# Patient Record
Sex: Male | Born: 1990 | Race: Black or African American | Hispanic: No | Marital: Single | Smoking: Never smoker
Health system: Southern US, Community
[De-identification: ages and names within clinical notes are randomized; demographics above are authoritative.]

## PROBLEM LIST (undated history)

## (undated) DIAGNOSIS — Z789 Other specified health status: Secondary | ICD-10-CM

## (undated) HISTORY — PX: NASAL CONCHA BULLOSA RESECTION: SHX2062

---
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
## 2012-04-14 ENCOUNTER — Emergency Department (HOSPITAL_COMMUNITY): Payer: Self-pay

## 2012-04-14 ENCOUNTER — Emergency Department (HOSPITAL_COMMUNITY)
Admission: EM | Admit: 2012-04-14 | Discharge: 2012-04-14 | Disposition: A | Discharge status: Home / Self Care | Payer: Self-pay | Attending: Emergency Medicine | Admitting: Emergency Medicine

## 2012-04-14 ENCOUNTER — Encounter (HOSPITAL_COMMUNITY): Payer: Self-pay | Admitting: Emergency Medicine

## 2012-04-14 DIAGNOSIS — S62309A Unspecified fracture of unspecified metacarpal bone, initial encounter for closed fracture: Secondary | ICD-10-CM

## 2012-04-14 DIAGNOSIS — S62319A Displaced fracture of base of unspecified metacarpal bone, initial encounter for closed fracture: Secondary | ICD-10-CM | POA: Insufficient documentation

## 2012-04-14 MED ORDER — HYDROCODONE-ACETAMINOPHEN 5-325 MG PO TABS: 2.0000 | ORAL_TABLET | ORAL | Status: AC | PRN

## 2012-04-14 NOTE — ED Notes (Signed)
Patient transported to X-ray 

## 2012-04-14 NOTE — ED Notes (Signed)
Pt. D/C home. Ambulatory. A.O. X 4 NAD. Prescription in hand. D/C teaching reviewed.
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
## 2012-04-14 NOTE — Discharge Instructions (Signed)
Metacarpal Fracture  Follow up with Dr. Melvyn Novas this week.  Return to the ED if you develop new or worsening symptoms.  The metacarpal bones are in the middle of the hand, connecting the fingers to the wrist. A metacarpal fracture is a break in one of these bones. It is common for an injury of the hand to break one or more of these bones. A metacarpal fracture of the fifth (little) finger, near the knuckle, is also known as a boxer's fracture. SYMPTOMS   Severe pain at the time of injury.   Pain, tenderness, swelling (especially the back of the hand).   Bruising of the hand within 48 hours.   Visible deformity, if the fracture out of alignment (displaced).   Numbness or paralysis from swelling in the hand, causing pressure on the blood vessels or nerves (uncommon).  CAUSES   Direct hit (trauma) to the hand, such as a striking blow with the fist.   Indirect stress to the hand, such as twisting or violent muscle contraction (uncommon).  RISK INCREASES WITH:  Contact sports (football, rugby, soccer).   Sports that require hitting (boxing, martial arts).   History of bone or joint disease, including osteoporosis.   Poor hand strength and flexibility.  PREVENTION  Maintain proper conditioning:   Hand and finger strength.   Flexibility and endurance.   For contact sports, wear properly fitted and padded protective equipment for the hand.   Learn and use proper technique when hitting, punching, and landing from a fall.  PROGNOSIS If treated properly, metacarpal fractures can be expected to heal within 4 to 6 weeks. For severe injuries, surgery may be needed. RELATED COMPLICATIONS   Fracture does not heal (nonunion).   Heals in a poor position, including twisted fingers (malunion).   Chronic pain, stiffness, or swelling of the hand.   Excessive bleeding in the hand, causing pressure and injury to nerves and blood vessels (rare).   Unstable or arthritic joint, following  repeated injury or delayed treatment.   Hindrance of normal hand growth in children.   Infection in open fractures (skin broken over fracture) or at the incision or pin sites, if surgery was performed.   Shortening or injured bones.   Bony bump (spur) or loss of shape of the knuckles.  TREATMENT  Treatment will vary, depending on the extent of the injury. First, ice and medicine will help reduce pain and inflammation. For a single metacarpal fracture that is not displaced and does not involve the joint, restraint is usually sufficient for healing to occur. Multiple metacarpal fractures, fractures that are displaced, or fractures involving the joint may require surgery. Surgery often involves placing pins and screws in the bones, to hold them in place. Restraint of the injury follows surgery, to allow for healing. After restraint (with or without surgery), stretching and strengthening exercises may be needed to regain strength and a full range of motion. Exercises may be done at home or with a therapist. Sometimes, depending on the sport and position, a brace or splint may be needed when first returning to sports. MEDICATION   Do not take pain medicine for 7 days before surgery.   Only take over-the-counter or prescription medicines for pain, fever, or discomfort as directed by your caregiver.   Prescription pain medicines are usually prescribed only after surgery. Use only as directed and only as much as you need.  COLD THERAPY  Cold treatment (icing) should be applied for 10 to 15 minutes every 2  to 3 hours for inflammation and pain, and immediately after activity that aggravates your symptoms. Use ice packs or an ice massage. SEEK IMMEDIATE MEDICAL CARE IF:   Pain, tenderness, or swelling gets worse even with treatment.   You have pain, numbness, or coldness in the hand.   Blue, gray, or dark color appears in the fingernails.   Any of the following occur after surgery:   You have an  oral temperature above 102 F (38.9 C), not controlled by medicine.   You have increased pain, swelling, redness, drainage of fluids, or bleeding in the affected area.   New, unexplained symptoms develop. (Drugs used in treatment may produce side effects.)  Document Released: 11/03/1998 Document Revised: 10/09/2011 Document Reviewed: 02/01/2009 [facility] Patient Information 2012 [place], [place].

## 2012-04-14 NOTE — ED Notes (Signed)
Ortho Tech paged to apply splint. 

## 2012-04-14 NOTE — Progress Notes (Signed)
Orthopedic Tech Progress Note Patient Details:  Jahmari Esbenshade [facility] 1991-10-25 [id number]  Ortho Devices Type of Ortho Device: Other (comment) (ulna gutter) Ortho Device/Splint Location: (R) UE Ortho Device/Splint Interventions: Application   Jennye Moccasin 04/14/2012, 2:53 PM

## 2012-04-22 ENCOUNTER — Encounter (HOSPITAL_COMMUNITY): Payer: Self-pay | Admitting: Pharmacy Technician

## 2012-04-23 ENCOUNTER — Encounter (HOSPITAL_COMMUNITY): Payer: Self-pay

## 2012-04-23 ENCOUNTER — Encounter (HOSPITAL_COMMUNITY)
Admission: RE | Admit: 2012-04-23 | Discharge: 2012-04-23 | Disposition: A | Discharge status: Home / Self Care | Payer: Self-pay | Source: Ambulatory Visit | Attending: Orthopedic Surgery | Admitting: Orthopedic Surgery

## 2012-04-23 HISTORY — DX: Other specified health status: Z78.9

## 2012-04-23 LAB — CBC
HCT: 41.6 % (ref 39.0–52.0)
MCHC: 33.2 g/dL (ref 30.0–36.0)
RDW: 11.9 % (ref 11.5–15.5)
WBC: 3.1 10*3/uL — ABNORMAL LOW (ref 4.0–10.5)

## 2012-04-23 LAB — SURGICAL PCR SCREEN
MRSA, PCR: NEGATIVE
Staphylococcus aureus: NEGATIVE

## 2012-04-23 NOTE — Pre-Procedure Instructions (Signed)
20 MANG HAZELRIGG  04/23/2012   Your procedure is scheduled on:  04/26/12  Report to [facility] at hydrocodone AM.  Call this number if you have problems the morning of surgery: [phone]   Remember:   Do not eat food or drink after midnight  Take these medicines the morning of surgery with A SIP OF WATER: hydrocodone   Do not wear jewelry, make-up or nail polish.  Do not wear lotions, powders, or perfumes. You may wear deodorant.  Do not shave 48 hours prior to surgery. Men may shave face and neck.  Do not bring valuables to the hospital.  Contacts, dentures or bridgework may not be worn into surgery.  Leave suitcase in the car. After surgery it may be brought to your room.  For patients admitted to the hospital, checkout time is 11:00 AM the day of discharge.   Patients discharged the day of surgery will not be allowed to drive home.  Name and phone number of your driver: family  Special Instructions: CHG Shower Use Special Wash: 1/2 bottle night before surgery and 1/2 bottle morning of surgery.   Please read over the following fact sheets that you were given: Pain Booklet, Coughing and Deep Breathing, MRSA Information and Surgical Site Infection Prevention

## 2012-04-25 NOTE — H&P (Signed)
Victor Holmes is an 21 y.o. male.   Chief Complaint: right hand injury HPI: Pt injured right hand. ED notes reviewed Pt was seen and evaluated in my office Pt here for surgery on right hand Right ring finger has displaced metacarpal shaft fracture  Past Medical History  Diagnosis Date  . No pertinent past medical history     Past Surgical History  Procedure Date  . Nasal concha bullosa resection     No family history on file. Social History:  reports that he has never smoked. He does not have any smokeless tobacco history on file. He reports that he drinks alcohol. He reports that he does not use illicit drugs.  Allergies: No Known Allergies  No prescriptions prior to admission    No results found for this or any previous visit (from the past 48 hour(s)). No results found.  No recent illnesses or hospitalizations  There were no vitals taken for this visit. General Appearance:  Alert, cooperative, no distress, appears stated age  Head:  Normocephalic, without obvious abnormality, atraumatic  Eyes:  Pupils equal, conjunctiva/corneas clear,         Throat: Lips, mucosa, and tongue normal; teeth and gums normal  Neck: No visible masses     Lungs:   respirations unlabored  Chest Wall:  No tenderness or deformity  Heart:  Regular rate and rhythm,  Abdomen:   Soft, non-tender,         Extremities: Right hand swollen dorsally lacks full mobility Good cap refil No open wounds No scars dorsally Bony prominence over ring finger metacarpal  Pulses: 2+ and symmetric  Skin: Skin color, texture, turgor normal, no rashes or lesions     Neurologic: Normal    Assessment/Plan:  Right ring finger metacarpal shaft fracture  Right ring finger open reduction and internal fixation possible pinning  R/B/A DISCUSSED WITH PT IN OFFICE.  PT VOICED UNDERSTANDING OF PLAN CONSENT SIGNED DAY OF SURGERY PT SEEN AND EXAMINED PRIOR TO OPERATIVE PROCEDURE/DAY OF SURGERY SITE  MARKED. QUESTIONS ANSWERED WILL [facility] FOLLOWING SURGERY  Sharma Covert 04/25/2012, 6:28 PM

## 2012-04-26 ENCOUNTER — Encounter (HOSPITAL_COMMUNITY): Payer: Self-pay | Admitting: Certified Registered"

## 2012-04-26 ENCOUNTER — Encounter (HOSPITAL_COMMUNITY)
Admission: RE | Disposition: A | Discharge status: Home / Self Care | Payer: Self-pay | Source: Ambulatory Visit | Attending: Orthopedic Surgery

## 2012-04-26 ENCOUNTER — Encounter (HOSPITAL_COMMUNITY): Payer: Self-pay | Admitting: *Deleted

## 2012-04-26 ENCOUNTER — Ambulatory Visit (HOSPITAL_COMMUNITY)
Admission: RE | Admit: 2012-04-26 | Discharge: 2012-04-26 | Disposition: A | Discharge status: Home / Self Care | Payer: Self-pay | Source: Ambulatory Visit | Attending: Orthopedic Surgery | Admitting: Orthopedic Surgery

## 2012-04-26 ENCOUNTER — Ambulatory Visit (HOSPITAL_COMMUNITY): Payer: Self-pay | Admitting: Certified Registered"

## 2012-04-26 DIAGNOSIS — X58XXXA Exposure to other specified factors, initial encounter: Secondary | ICD-10-CM | POA: Insufficient documentation

## 2012-04-26 DIAGNOSIS — S62329A Displaced fracture of shaft of unspecified metacarpal bone, initial encounter for closed fracture: Secondary | ICD-10-CM | POA: Insufficient documentation

## 2012-04-26 DIAGNOSIS — Z01812 Encounter for preprocedural laboratory examination: Secondary | ICD-10-CM | POA: Insufficient documentation

## 2012-04-26 HISTORY — PX: ORIF FINGER FRACTURE: SHX2122

## 2012-04-26 SURGERY — OPEN REDUCTION INTERNAL FIXATION (ORIF) METACARPAL (FINGER) FRACTURE
Anesthesia: General | Site: Hand | Laterality: Right | Wound class: Clean

## 2012-04-26 MED ORDER — 0.9 % SODIUM CHLORIDE (POUR BTL) OPTIME
TOPICAL | Status: DC | PRN
  Administered 2012-04-26: 1000 mL

## 2012-04-26 MED ORDER — LACTATED RINGERS IV SOLN
INTRAVENOUS | Status: DC | PRN
  Administered 2012-04-26 (×2): via INTRAVENOUS

## 2012-04-26 MED ORDER — MIDAZOLAM HCL 2 MG/2ML IJ SOLN: 1.0000 mg | INTRAMUSCULAR | Status: DC | PRN

## 2012-04-26 MED ORDER — FENTANYL CITRATE 0.05 MG/ML IJ SOLN
50.0000 ug | INTRAMUSCULAR | Status: DC | PRN
Start: 2012-04-26 — End: 2012-04-27

## 2012-04-26 MED ORDER — BUPIVACAINE HCL (PF) 0.25 % IJ SOLN
INTRAMUSCULAR | Status: DC | PRN
  Administered 2012-04-26: 10 mL

## 2012-04-26 MED ORDER — LIDOCAINE HCL (CARDIAC) 20 MG/ML IV SOLN
INTRAVENOUS | Status: DC | PRN
  Administered 2012-04-26: 80 mg via INTRAVENOUS

## 2012-04-26 MED ORDER — MIDAZOLAM HCL 5 MG/5ML IJ SOLN
INTRAMUSCULAR | Status: DC | PRN
  Administered 2012-04-26: 2 mg via INTRAVENOUS

## 2012-04-26 MED ORDER — PROPOFOL 10 MG/ML IV EMUL
INTRAVENOUS | Status: DC | PRN
  Administered 2012-04-26: 200 mg via INTRAVENOUS

## 2012-04-26 MED ORDER — CHLORHEXIDINE GLUCONATE 4 % EX LIQD: 60.0000 mL | Freq: Once | CUTANEOUS | Status: DC

## 2012-04-26 MED ORDER — FENTANYL CITRATE 0.05 MG/ML IJ SOLN
INTRAMUSCULAR | Status: DC | PRN
  Administered 2012-04-26 (×5): 50 ug via INTRAVENOUS

## 2012-04-26 MED ORDER — OXYCODONE-ACETAMINOPHEN 5-325 MG PO TABS
2.0000 | ORAL_TABLET | Freq: Once | ORAL | Status: AC
  Administered 2012-04-26: 2 via ORAL

## 2012-04-26 MED ORDER — LORAZEPAM 2 MG/ML IJ SOLN: 1.0000 mg | Freq: Once | INTRAMUSCULAR | Status: DC | PRN

## 2012-04-26 MED ORDER — CEFAZOLIN SODIUM 1-5 GM-% IV SOLN
INTRAVENOUS | Status: DC | PRN
  Administered 2012-04-26: 1 g via INTRAVENOUS

## 2012-04-26 MED ORDER — HYDROMORPHONE HCL PF 1 MG/ML IJ SOLN: 0.2500 mg | INTRAMUSCULAR | Status: DC | PRN

## 2012-04-26 MED ORDER — DOCUSATE SODIUM 100 MG PO CAPS: 100.0000 mg | ORAL_CAPSULE | Freq: Two times a day (BID) | ORAL | Status: AC

## 2012-04-26 MED ORDER — OXYCODONE-ACETAMINOPHEN 5-325 MG PO TABS: 1.0000 | ORAL_TABLET | ORAL | Status: AC | PRN

## 2012-04-26 SURGICAL SUPPLY — 64 items
BANDAGE ELASTIC 3 VELCRO ST LF (GAUZE/BANDAGES/DRESSINGS) ×1 IMPLANT
BANDAGE ELASTIC 4 VELCRO ST LF (GAUZE/BANDAGES/DRESSINGS) ×1 IMPLANT
BANDAGE GAUZE ELAST BULKY 4 IN (GAUZE/BANDAGES/DRESSINGS) ×1 IMPLANT
BIT DRILL 1.1 (BIT) ×2
BIT DRILL 1.1 MINI QC NONSTRL (BIT) ×1 IMPLANT
BIT DRILL 60X20X1.1XQC TMX (BIT) IMPLANT
BIT DRL 60X20X1.1XQC TMX (BIT) ×1
BNDG CMPR 9X4 STRL LF SNTH (GAUZE/BANDAGES/DRESSINGS) ×1
BNDG CMPR MD 5X2 ELC HKLP STRL (GAUZE/BANDAGES/DRESSINGS) ×1
BNDG COHESIVE 1X5 TAN STRL LF (GAUZE/BANDAGES/DRESSINGS) IMPLANT
BNDG ELASTIC 2 VLCR STRL LF (GAUZE/BANDAGES/DRESSINGS) ×2 IMPLANT
BNDG ESMARK 4X9 LF (GAUZE/BANDAGES/DRESSINGS) ×2 IMPLANT
CAP PIN ORTHO PINK (CAP) IMPLANT
CAP PIN PROTECTOR ORTHO WHT (CAP) IMPLANT
CLOTH BEACON ORANGE TIMEOUT ST (SAFETY) ×2 IMPLANT
CORDS BIPOLAR (ELECTRODE) ×2 IMPLANT
COVER SURGICAL LIGHT HANDLE (MISCELLANEOUS) ×2 IMPLANT
CUFF TOURNIQUET SINGLE 18IN (TOURNIQUET CUFF) ×2 IMPLANT
CUFF TOURNIQUET SINGLE 24IN (TOURNIQUET CUFF) IMPLANT
DRAPE OEC MINIVIEW 54X84 (DRAPES) ×1 IMPLANT
DRAPE SURG 17X23 STRL (DRAPES) ×1 IMPLANT
DRSG ADAPTIC 3X8 NADH LF (GAUZE/BANDAGES/DRESSINGS) IMPLANT
GAUZE SPONGE 2X2 8PLY STRL LF (GAUZE/BANDAGES/DRESSINGS) IMPLANT
GAUZE XEROFORM 1X8 LF (GAUZE/BANDAGES/DRESSINGS) ×1 IMPLANT
GLOVE BIOGEL PI IND STRL 8.5 (GLOVE) ×1 IMPLANT
GLOVE BIOGEL PI INDICATOR 8.5 (GLOVE) ×1
GLOVE SURG ORTHO 8.0 STRL STRW (GLOVE) ×2 IMPLANT
GOWN PREVENTION PLUS XLARGE (GOWN DISPOSABLE) ×2 IMPLANT
GOWN STRL NON-REIN LRG LVL3 (GOWN DISPOSABLE) ×4 IMPLANT
K-WIRE SMTH SNGL TROCAR .028X4 (WIRE)
KIT BASIN OR (CUSTOM PROCEDURE TRAY) ×2 IMPLANT
KIT ROOM TURNOVER OR (KITS) ×2 IMPLANT
KWIRE SMTH SNGL TROCAR .028X4 (WIRE) IMPLANT
MANIFOLD NEPTUNE II (INSTRUMENTS) ×1 IMPLANT
NDL HYPO 25GX1X1/2 BEV (NEEDLE) IMPLANT
NEEDLE HYPO 25GX1X1/2 BEV (NEEDLE) ×2 IMPLANT
NS IRRIG 1000ML POUR BTL (IV SOLUTION) ×2 IMPLANT
PACK ORTHO EXTREMITY (CUSTOM PROCEDURE TRAY) ×2 IMPLANT
PAD ARMBOARD 7.5X6 YLW CONV (MISCELLANEOUS) ×4 IMPLANT
PAD CAST 4YDX4 CTTN HI CHSV (CAST SUPPLIES) IMPLANT
PADDING CAST ABS 4INX4YD NS (CAST SUPPLIES) ×1
PADDING CAST ABS COTTON 4X4 ST (CAST SUPPLIES) IMPLANT
PADDING CAST COTTON 4X4 STRL (CAST SUPPLIES)
PADDING UNDERCAST 2  STERILE (CAST SUPPLIES) ×2 IMPLANT
PLATE STRAIGHT LOCK 1.5 (Plate) ×1 IMPLANT
SCREW L 1.5X12 (Screw) ×1 IMPLANT
SCREW LOCKING 1.5X11MM (Screw) ×1 IMPLANT
SCREW NL 1.5X11 WRIST (Screw) ×1 IMPLANT
SCREW NONIOC 1.5 10M (Screw) ×1 IMPLANT
SOAP 2 % CHG 4 OZ (WOUND CARE) ×2 IMPLANT
SPLINT FIBERGLASS 3X35 (CAST SUPPLIES) ×1 IMPLANT
SPONGE GAUZE 2X2 STER 10/PKG (GAUZE/BANDAGES/DRESSINGS)
SPONGE GAUZE 4X4 12PLY (GAUZE/BANDAGES/DRESSINGS) ×1 IMPLANT
SUCTION FRAZIER TIP 10 FR DISP (SUCTIONS) IMPLANT
SUT MERSILENE 4 0 P 3 (SUTURE) IMPLANT
SUT MNCRL AB 3-0 PS2 18 (SUTURE) ×1 IMPLANT
SUT MNCRL AB 4-0 PS2 18 (SUTURE) ×1 IMPLANT
SUT PROLENE 4 0 PS 2 18 (SUTURE) ×1 IMPLANT
SYR CONTROL 10ML LL (SYRINGE) ×1 IMPLANT
TOWEL OR 17X24 6PK STRL BLUE (TOWEL DISPOSABLE) ×2 IMPLANT
TOWEL OR 17X26 10 PK STRL BLUE (TOWEL DISPOSABLE) ×2 IMPLANT
TUBE CONNECTING 12X1/4 (SUCTIONS) IMPLANT
UNDERPAD 30X30 INCONTINENT (UNDERPADS AND DIAPERS) ×2 IMPLANT
WATER STERILE IRR 1000ML POUR (IV SOLUTION) ×2 IMPLANT

## 2012-04-26 NOTE — Brief Op Note (Signed)
04/26/2012  7:50 PM  PATIENT:  Haze Justin Bernhart  21 y.o. male  PRE-OPERATIVE DIAGNOSIS:  RIGHT RING FINGER METACARPAL SHAFT FRACTURE  POST-OPERATIVE DIAGNOSIS:  * No post-op diagnosis entered *  PROCEDURE:  Procedure(s) (LRB): OPEN REDUCTION INTERNAL FIXATION (ORIF) METACARPAL (FINGER) FRACTURE (Right)  SURGEON:  Surgeon(s) and Role:    * Sharma Covert, MD - Primary  PHYSICIAN ASSISTANT:   ASSISTANTS: none   ANESTHESIA:   general  EBL:     BLOOD ADMINISTERED:none  DRAINS: none   LOCAL MEDICATIONS USED:  MARCAINE     SPECIMEN:  No Specimen  DISPOSITION OF SPECIMEN:  N/A  COUNTS:  YES  TOURNIQUET:  * Missing tourniquet times found for documented tourniquets in log:  [id number] *  DICTATION: .409811  PLAN OF CARE: Discharge to home after PACU  PATIENT DISPOSITION:  PACU - hemodynamically stable.   Delay start of Pharmacological VTE agent (>24hrs) due to surgical blood loss or risk of bleeding: not applicable

## 2012-04-26 NOTE — Anesthesia Postprocedure Evaluation (Signed)
  Anesthesia Post-op Note  Patient: Victor Holmes  Procedure(s) Performed: Procedure(s) (LRB): OPEN REDUCTION INTERNAL FIXATION (ORIF) METACARPAL (FINGER) FRACTURE (Right)  Patient Location: PACU  Anesthesia Type: General  Level of Consciousness: awake and alert   Airway and Oxygen Therapy: Patient Spontanous Breathing  Post-op Pain: mild  Post-op Assessment: Post-op Vital signs reviewed, Patient's Cardiovascular Status Stable, Respiratory Function Stable, Patent Airway, No signs of Nausea or vomiting and Pain level controlled  Post-op Vital Signs: stable  Complications: No apparent anesthesia complications

## 2012-04-26 NOTE — Transfer of Care (Signed)
Immediate Anesthesia Transfer of Care Note  Patient: Victor Holmes  Procedure(s) Performed: Procedure(s) (LRB): OPEN REDUCTION INTERNAL FIXATION (ORIF) METACARPAL (FINGER) FRACTURE (Right)  Patient Location: PACU  Anesthesia Type: General  Level of Consciousness: awake, alert  and oriented  Airway & Oxygen Therapy: Patient Spontanous Breathing and Patient connected to nasal cannula oxygen  Post-op Assessment: Report given to PACU RN, Post -op Vital signs reviewed and stable and Patient moving all extremities  Post vital signs: Reviewed and stable  Complications: No apparent anesthesia complications

## 2012-04-26 NOTE — Discharge Instructions (Signed)
KEEP BANDAGE CLEAN AND DRY CALL OFFICE FOR F/U APPT [phone] in 2 weeks KEEP HAND ELEVATED ABOVE HEART OK TO APPLY ICE TO OPERATIVE AREA CONTACT OFFICE IF ANY WORSENING PAIN OR CONCERNS.  

## 2012-04-26 NOTE — Anesthesia Preprocedure Evaluation (Addendum)
Anesthesia Evaluation  Patient identified by MRN, date of birth, ID band Patient awake    Reviewed: Allergy & Precautions, H&P , NPO status , Patient's Chart, lab work & pertinent test results  History of Anesthesia Complications Negative for: history of anesthetic complications  Airway Mallampati: I TM Distance: >3 FB Neck ROM: Full    Dental  (+) Teeth Intact   Pulmonary  breath sounds clear to auscultation        Cardiovascular Rhythm:Regular Rate:Normal     Neuro/Psych    GI/Hepatic   Endo/Other    Renal/GU      Musculoskeletal   Abdominal   Peds  (+) Delivery details - Hematology   Anesthesia Other Findings   Reproductive/Obstetrics                          Anesthesia Physical Anesthesia Plan  ASA: II and Emergent  Anesthesia Plan: General   Post-op Pain Management:    Induction: Intravenous  Airway Management Planned: LMA  Additional Equipment:   Intra-op Plan:   Post-operative Plan: Extubation in OR  Informed Consent: I have reviewed the patients History and Physical, chart, labs and discussed the procedure including the risks, benefits and alternatives for the proposed anesthesia with the patient or authorized representative who has indicated his/her understanding and acceptance.   Dental advisory given  Plan Discussed with: Surgeon and CRNA  Anesthesia Plan Comments:        Anesthesia Quick Evaluation
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
## 2012-04-29 ENCOUNTER — Encounter (HOSPITAL_COMMUNITY): Payer: Self-pay | Admitting: Orthopedic Surgery
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
## 2014-09-30 ENCOUNTER — Emergency Department (HOSPITAL_COMMUNITY)
Admission: EM | Admit: 2014-09-30 | Discharge: 2014-10-01 | Disposition: A | Discharge status: Home / Self Care | Payer: Self-pay | Attending: Emergency Medicine | Admitting: Emergency Medicine

## 2014-09-30 ENCOUNTER — Encounter (HOSPITAL_COMMUNITY): Payer: Self-pay | Admitting: *Deleted

## 2014-09-30 DIAGNOSIS — S50311A Abrasion of right elbow, initial encounter: Secondary | ICD-10-CM | POA: Insufficient documentation

## 2014-09-30 DIAGNOSIS — R55 Syncope and collapse: Secondary | ICD-10-CM | POA: Insufficient documentation

## 2014-09-30 DIAGNOSIS — S6991XA Unspecified injury of right wrist, hand and finger(s), initial encounter: Secondary | ICD-10-CM | POA: Insufficient documentation

## 2014-09-30 DIAGNOSIS — R51 Headache: Secondary | ICD-10-CM

## 2014-09-30 DIAGNOSIS — S8991XA Unspecified injury of right lower leg, initial encounter: Secondary | ICD-10-CM | POA: Insufficient documentation

## 2014-09-30 DIAGNOSIS — Y9389 Activity, other specified: Secondary | ICD-10-CM | POA: Insufficient documentation

## 2014-09-30 DIAGNOSIS — Y998 Other external cause status: Secondary | ICD-10-CM | POA: Insufficient documentation

## 2014-09-30 DIAGNOSIS — S060X9A Concussion with loss of consciousness of unspecified duration, initial encounter: Secondary | ICD-10-CM | POA: Insufficient documentation

## 2014-09-30 DIAGNOSIS — S50312A Abrasion of left elbow, initial encounter: Secondary | ICD-10-CM | POA: Insufficient documentation

## 2014-09-30 DIAGNOSIS — Y9289 Other specified places as the place of occurrence of the external cause: Secondary | ICD-10-CM | POA: Insufficient documentation

## 2014-09-30 DIAGNOSIS — R52 Pain, unspecified: Secondary | ICD-10-CM

## 2014-09-30 DIAGNOSIS — S0031XA Abrasion of nose, initial encounter: Secondary | ICD-10-CM | POA: Insufficient documentation

## 2014-09-30 DIAGNOSIS — R1110 Vomiting, unspecified: Secondary | ICD-10-CM | POA: Insufficient documentation

## 2014-09-30 DIAGNOSIS — S6992XA Unspecified injury of left wrist, hand and finger(s), initial encounter: Secondary | ICD-10-CM | POA: Insufficient documentation

## 2014-09-30 DIAGNOSIS — S0993XA Unspecified injury of face, initial encounter: Secondary | ICD-10-CM

## 2014-09-30 DIAGNOSIS — S0081XA Abrasion of other part of head, initial encounter: Secondary | ICD-10-CM | POA: Insufficient documentation

## 2014-09-30 DIAGNOSIS — R519 Headache, unspecified: Secondary | ICD-10-CM

## 2014-09-30 DIAGNOSIS — S0990XA Unspecified injury of head, initial encounter: Secondary | ICD-10-CM

## 2014-09-30 MED ORDER — ONDANSETRON HCL 4 MG/2ML IJ SOLN
4.0000 mg | Freq: Once | INTRAMUSCULAR | Status: AC
  Administered 2014-10-01: 4 mg via INTRAVENOUS
  Filled 2014-09-30: qty 2

## 2014-09-30 MED ORDER — TETANUS-DIPHTH-ACELL PERTUSSIS 5-2.5-18.5 LF-MCG/0.5 IM SUSP
0.5000 mL | Freq: Once | INTRAMUSCULAR | Status: DC
  Filled 2014-09-30: qty 0.5
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
## 2014-10-01 ENCOUNTER — Emergency Department (HOSPITAL_COMMUNITY): Payer: Self-pay

## 2014-10-01 LAB — CBC
HCT: 42.8 % (ref 39.0–52.0)
HEMOGLOBIN: 14.3 g/dL (ref 13.0–17.0)
MCH: 29.8 pg (ref 26.0–34.0)
MCHC: 33.4 g/dL (ref 30.0–36.0)
MCV: 89.2 fL (ref 78.0–100.0)
PLATELETS: 299 10*3/uL (ref 150–400)
RBC: 4.8 MIL/uL (ref 4.22–5.81)
RDW: 12.1 % (ref 11.5–15.5)
WBC: 10 10*3/uL (ref 4.0–10.5)

## 2014-10-01 LAB — URINALYSIS, ROUTINE W REFLEX MICROSCOPIC
Bilirubin Urine: NEGATIVE
GLUCOSE, UA: NEGATIVE mg/dL
Hgb urine dipstick: NEGATIVE
KETONES UR: NEGATIVE mg/dL
LEUKOCYTES UA: NEGATIVE
NITRITE: NEGATIVE
PH: 6.5 (ref 5.0–8.0)
Protein, ur: NEGATIVE mg/dL
SPECIFIC GRAVITY, URINE: 1.011 (ref 1.005–1.030)
Urobilinogen, UA: 0.2 mg/dL (ref 0.0–1.0)

## 2014-10-01 MED ORDER — SODIUM CHLORIDE 0.9 % IV BOLUS (SEPSIS)
1000.0000 mL | Freq: Once | INTRAVENOUS | Status: AC
  Administered 2014-10-01: 1000 mL via INTRAVENOUS

## 2014-10-01 MED ORDER — MORPHINE SULFATE 2 MG/ML IJ SOLN
2.0000 mg | Freq: Once | INTRAMUSCULAR | Status: AC
  Administered 2014-10-01: 2 mg via INTRAVENOUS
  Filled 2014-10-01: qty 1

## 2014-10-01 MED ORDER — NAPROXEN 500 MG PO TABS: 500.0000 mg | ORAL_TABLET | Freq: Two times a day (BID) | ORAL | Status: DC

## 2014-10-01 MED ORDER — TRAMADOL HCL 50 MG PO TABS: 50.0000 mg | ORAL_TABLET | Freq: Four times a day (QID) | ORAL | Status: AC | PRN

## 2014-10-01 NOTE — ED Notes (Signed)
Pt refusing TDAP, educated on risks vs benefits and still refusing

## 2014-10-01 NOTE — Discharge Instructions (Signed)
Your xrays show no broken bones - see your doctor as needed - RICE therapy -s ee attached

## 2016-04-25 ENCOUNTER — Emergency Department (HOSPITAL_COMMUNITY)
Admission: EM | Admit: 2016-04-25 | Discharge: 2016-04-25 | Disposition: A | Discharge status: Home / Self Care | Payer: Self-pay | Attending: Dermatology | Admitting: Dermatology

## 2016-04-25 DIAGNOSIS — Z5321 Procedure and treatment not carried out due to patient leaving prior to being seen by health care provider: Secondary | ICD-10-CM | POA: Insufficient documentation

## 2016-04-25 NOTE — ED Notes (Signed)
No answer when called for triage 

## 2016-04-26 ENCOUNTER — Encounter (HOSPITAL_COMMUNITY): Payer: Self-pay

## 2016-04-26 ENCOUNTER — Emergency Department (HOSPITAL_COMMUNITY)
Admission: EM | Admit: 2016-04-26 | Discharge: 2016-04-26 | Disposition: A | Discharge status: Home / Self Care | Payer: Self-pay | Attending: Emergency Medicine | Admitting: Emergency Medicine

## 2016-04-26 DIAGNOSIS — Y999 Unspecified external cause status: Secondary | ICD-10-CM | POA: Insufficient documentation

## 2016-04-26 DIAGNOSIS — S61210A Laceration without foreign body of right index finger without damage to nail, initial encounter: Secondary | ICD-10-CM | POA: Insufficient documentation

## 2016-04-26 DIAGNOSIS — W260XXA Contact with knife, initial encounter: Secondary | ICD-10-CM | POA: Insufficient documentation

## 2016-04-26 DIAGNOSIS — S61219A Laceration without foreign body of unspecified finger without damage to nail, initial encounter: Secondary | ICD-10-CM

## 2016-04-26 DIAGNOSIS — Y929 Unspecified place or not applicable: Secondary | ICD-10-CM | POA: Insufficient documentation

## 2016-04-26 DIAGNOSIS — Y93G1 Activity, food preparation and clean up: Secondary | ICD-10-CM | POA: Insufficient documentation

## 2016-04-26 NOTE — ED Notes (Signed)
Declined W/C at D/C and was escorted to lobby by RN. 

## 2016-04-26 NOTE — ED Provider Notes (Signed)
CSN: [id number]     Arrival date & time 04/26/16  0930 History  By signing my name below, I, Tanda RockersMargaux Venter, attest that this documentation has been prepared under the direction and in the presence of Audry Piliyler Warda Mcqueary, PA-C.  Electronically Signed: Tanda RockersMargaux Venter, ED Scribe. 04/26/2016. 9:53 AM.   No chief complaint on file.  The history is provided by the patient. No language interpreter was used.   HPI Comments: Victor Holmes is a 25 y.o. male who presents to the Emergency Department complaining of laceration to left index finger that occurred last night at 10:30 PM. Pt reports that he was washing dishes when he accidentally cut his finger with a knife. He has been applying pressure to the area and applied neosporin to it. Bleeding is controlled with pressure. He notes mild pain to the area as well. Pain is 7/10. Pt has not taken any medication for the pain. Denies weakness, numbness, tingling, fever, chills, or any other associated symptoms. Tetanus up to date.   Past Medical History  Diagnosis Date  . No pertinent past medical history    Past Surgical History  Procedure Laterality Date  . Nasal concha bullosa resection    . Orif finger fracture  04/26/2012    Procedure: OPEN REDUCTION INTERNAL FIXATION (ORIF) METACARPAL (FINGER) FRACTURE;  Surgeon: Sharma CovertFred W Ortmann, MD;  Location: [facility] OR;  Service: Orthopedics;  Laterality: Right;   No family history on file. Social History  Substance Use Topics  . Smoking status: Never Smoker   . Smokeless tobacco: None  . Alcohol Use: Yes     Comment: other wweekly    Review of Systems  Constitutional: Negative for fever and chills.  Musculoskeletal: Positive for arthralgias.  Skin: Positive for wound.  Neurological: Negative for weakness and numbness.   Allergies  Review of patient's allergies indicates no known allergies.  Home Medications   Prior to Admission medications   Medication Sig Start Date End Date Taking? Authorizing Provider   naproxen (NAPROSYN) 500 MG tablet Take 1 tablet (500 mg total) by mouth 2 (two) times daily with a meal. 10/01/14   Eber HongBrian Miller, MD  traMADol (ULTRAM) 50 MG tablet Take 1 tablet (50 mg total) by mouth every 6 (six) hours as needed. 10/01/14   Eber HongBrian Miller, MD   BP 112/67 mmHg  Pulse 62  Temp(Src) 97.5 F (36.4 C)  Resp 18  SpO2 100%   Physical Exam  Constitutional: He is oriented to person, place, and time. He appears well-developed and well-nourished. No distress.  HENT:  Head: Normocephalic and atraumatic.  Eyes: Conjunctivae and EOM are normal.  Neck: Neck supple. No tracheal deviation present.  Cardiovascular: Normal rate.   Pulmonary/Chest: Effort normal. No respiratory distress.  Musculoskeletal: Normal range of motion.  Left index finger with superficial laceration distal to DIP. Bleeding controlled. Neurovascularly intact.   Neurological: He is alert and oriented to person, place, and time.  Skin: Skin is warm and dry.  Psychiatric: He has a normal mood and affect. His behavior is normal.  Nursing note and vitals reviewed.  ED Course  Procedures (including critical care time)   DIAGNOSTIC STUDIES: Oxygen Saturation is 100% on RA, normal by my interpretation.    COORDINATION OF CARE: 9:51 AM-Discussed treatment plan which includes wound care with pt at bedside and pt agreed to plan.   Labs Review Labs Reviewed - No data to display  Imaging Review No results found. I have personally reviewed and evaluated these images and  lab results as part of my medical decision-making.   EKG Interpretation None      MDM  I have reviewed the relevant previous healthcare records. I obtained HPI from historian.  ED Course:  Assessment: Pt with left index finger laceration that occurred last night. Tetanus UTD. Laceration too wide for wound closure with sutures, but It is superficial in nature and should heal well without complication, no signs of infection. Neurovascularly  intact. Bleeding is controlled. No indication for ABX.  Counciled.pt to keep area clean with neosporin and wrapped until follow up with PCP.  Pt is hemodynamically stable with no complaints prior to dc.    Disposition/Plan:  DC Home Additional Verbal discharge instructions given and discussed with patient.  Pt Instructed to f/u with PCP in the next week for evaluation and treatment of symptoms. Return precautions given Pt acknowledges and agrees with plan  Supervising Physician Cathren LaineKevin Steinl, MD   Final diagnoses:  Finger laceration, initial encounter   I personally performed the services described in this documentation, which was scribed in my presence. The recorded information has been reviewed and is accurate.   Audry Piliyler Amara Manalang, PA-C 04/26/16 [id number]  Cathren LaineKevin Steinl, MD 04/26/16 1332

## 2016-04-26 NOTE — ED Notes (Addendum)
Patient here last night but left prior to being seen. Laceration left hand index finger with knife. Saline dressing applied.

## 2016-04-26 NOTE — Discharge Instructions (Signed)
Please read and follow all provided instructions.  Your diagnoses today include:  1. Finger laceration, initial encounter    Tests performed today include:  Vital signs. See below for your results today.   Medications prescribed:   Take as prescribed   Home care instructions:  Follow any educational materials contained in this packet.  Follow-up instructions: Please follow-up with your primary care provider for further evaluation of symptoms and treatment   Return instructions:   Please return to the Emergency Department if you do not get better, if you get worse, or new symptoms OR  - Fever (temperature greater than 101.33F)  - Bleeding that does not stop with holding pressure to the area    -Severe pain (please note that you may be more sore the day after your accident)  - Chest Pain  - Difficulty breathing  - Severe nausea or vomiting  - Inability to tolerate food and liquids  - Passing out  - Skin becoming red around your wounds  - Change in mental status (confusion or lethargy)  - New numbness or weakness     Please return if you have any other emergent concerns.  Additional Information:  Your vital signs today were: BP 112/67 mmHg   Pulse 62   Temp(Src) 97.5 F (36.4 C)   Resp 18   SpO2 100% If your blood pressure (BP) was elevated above 135/85 this visit, please have this repeated by your doctor within one month. ---------------

## 2018-02-21 ENCOUNTER — Other Ambulatory Visit: Payer: Self-pay

## 2018-02-21 ENCOUNTER — Emergency Department (HOSPITAL_COMMUNITY)
Admission: EM | Admit: 2018-02-21 | Discharge: 2018-02-21 | Disposition: A | Discharge status: Home / Self Care | Payer: Self-pay | Attending: Emergency Medicine | Admitting: Emergency Medicine

## 2018-02-21 ENCOUNTER — Emergency Department (HOSPITAL_COMMUNITY): Payer: Self-pay

## 2018-02-21 ENCOUNTER — Encounter (HOSPITAL_COMMUNITY): Payer: Self-pay | Admitting: Emergency Medicine

## 2018-02-21 DIAGNOSIS — N451 Epididymitis: Secondary | ICD-10-CM | POA: Insufficient documentation

## 2018-02-21 LAB — COMPREHENSIVE METABOLIC PANEL
ALBUMIN: 4 g/dL (ref 3.5–5.0)
ALK PHOS: 110 U/L (ref 38–126)
ALT: 8 U/L — AB (ref 17–63)
ANION GAP: 8 (ref 5–15)
AST: 16 U/L (ref 15–41)
BILIRUBIN TOTAL: 0.9 mg/dL (ref 0.3–1.2)
BUN: 6 mg/dL (ref 6–20)
CALCIUM: 9.2 mg/dL (ref 8.9–10.3)
CO2: 26 mmol/L (ref 22–32)
Chloride: 102 mmol/L (ref 101–111)
Creatinine, Ser: 0.86 mg/dL (ref 0.61–1.24)
GFR calc Af Amer: >60 mL/min (ref >60)
GFR calc non Af Amer: >60 mL/min (ref >60)
GLUCOSE: 110 mg/dL — AB (ref 65–99)
Potassium: 3.7 mmol/L (ref 3.5–5.1)
Sodium: 136 mmol/L (ref 135–145)
TOTAL PROTEIN: 7.6 g/dL (ref 6.5–8.1)

## 2018-02-21 LAB — CBC
HEMATOCRIT: 41.2 % (ref 39.0–52.0)
HEMOGLOBIN: 13.4 g/dL (ref 13.0–17.0)
MCH: 30.3 pg (ref 26.0–34.0)
MCHC: 32.5 g/dL (ref 30.0–36.0)
MCV: 93.2 fL (ref 78.0–100.0)
Platelets: 292 10*3/uL (ref 150–400)
RBC: 4.42 MIL/uL (ref 4.22–5.81)
RDW: 12.1 % (ref 11.5–15.5)
WBC: 5.5 10*3/uL (ref 4.0–10.5)

## 2018-02-21 LAB — URINALYSIS, ROUTINE W REFLEX MICROSCOPIC
BILIRUBIN URINE: NEGATIVE
Glucose, UA: NEGATIVE mg/dL
HGB URINE DIPSTICK: NEGATIVE
Ketones, ur: NEGATIVE mg/dL
NITRITE: NEGATIVE
PROTEIN: NEGATIVE mg/dL
RBC / HPF: NONE SEEN RBC/hpf (ref 0–5)
Specific Gravity, Urine: 1.017 (ref 1.005–1.030)
pH: 7 (ref 5.0–8.0)

## 2018-02-21 LAB — LIPASE, BLOOD: Lipase: 20 U/L (ref 11–51)

## 2018-02-21 MED ORDER — DOXYCYCLINE HYCLATE 100 MG PO TABS
100.0000 mg | ORAL_TABLET | Freq: Once | ORAL | Status: AC
  Administered 2018-02-21: 100 mg via ORAL
  Filled 2018-02-21: qty 1

## 2018-02-21 MED ORDER — LIDOCAINE HCL (PF) 1 % IJ SOLN
INTRAMUSCULAR | Status: AC
  Administered 2018-02-21: 5 mL
  Filled 2018-02-21: qty 5

## 2018-02-21 MED ORDER — DOXYCYCLINE HYCLATE 100 MG PO CAPS: 100.0000 mg | ORAL_CAPSULE | Freq: Two times a day (BID) | ORAL | 0 refills | Status: DC

## 2018-02-21 MED ORDER — CEFTRIAXONE SODIUM 250 MG IJ SOLR
250.0000 mg | Freq: Once | INTRAMUSCULAR | Status: AC
  Administered 2018-02-21: 250 mg via INTRAMUSCULAR
  Filled 2018-02-21: qty 250

## 2018-02-21 MED ORDER — DOXYCYCLINE HYCLATE 100 MG PO TABS: 100.0000 mg | ORAL_TABLET | Freq: Once | ORAL | Status: DC

## 2018-02-21 NOTE — ED Triage Notes (Signed)
Patient presents to ED for assessment of RLQ abdominal pain waking him up from sleep 2 hours ago.  Patient denies any associated symptoms at this time.

## 2018-02-21 NOTE — ED Notes (Signed)
Patient transported to Ultrasound 

## 2018-02-21 NOTE — ED Provider Notes (Signed)
[facility] EMERGENCY DEPARTMENT Provider Note  CSN: [id number] Arrival date & time: 02/21/18 1478  Chief Complaint(s) Abdominal Pain  HPI Victor Holmes is a 27 y.o. male   The history is provided by the patient.  Abdominal Pain   This is a new problem. The current episode started 3 to 5 hours ago. The problem occurs constantly. The problem has not changed since onset.The pain is associated with an unknown factor. The pain is located in the RLQ. The quality of the pain is sharp. The pain is moderate. Pertinent negatives include fever, diarrhea, hematochezia, melena, nausea, vomiting and dysuria. The symptoms are aggravated by palpation. Nothing relieves the symptoms.   Patient was recently treated for trichomonas with single dose of 2 g of Flagyl.  Partner was also treated with a seven-day course of Flagyl which she just completed.  She tested negative for GC/chlamydia.  Patient is unsure whether he was tested.  Patient and partner declined anal intercourse.  Past Medical History Past Medical History:  Diagnosis Date  . No pertinent past medical history    There are no active problems to display for this patient.  Home Medication(s) Prior to Admission medications   Medication Sig Start Date End Date Taking? Authorizing Provider  doxycycline (VIBRAMYCIN) 100 MG capsule Take 1 capsule (100 mg total) by mouth 2 (two) times daily. 02/21/18   Nira Conn, MD  naproxen (NAPROSYN) 500 MG tablet Take 1 tablet (500 mg total) by mouth 2 (two) times daily with a meal. Patient not taking: Reported on 02/21/2018 10/01/14   Eber Hong, MD  traMADol (ULTRAM) 50 MG tablet Take 1 tablet (50 mg total) by mouth every 6 (six) hours as needed. Patient not taking: Reported on 02/21/2018 10/01/14   Eber Hong, MD                                                                                                                                    Past Surgical History Past  Surgical History:  Procedure Laterality Date  . NASAL CONCHA BULLOSA RESECTION    . ORIF FINGER FRACTURE  04/26/2012   Procedure: OPEN REDUCTION INTERNAL FIXATION (ORIF) METACARPAL (FINGER) FRACTURE;  Surgeon: Sharma Covert, MD;  Location: [facility] OR;  Service: Orthopedics;  Laterality: Right;   Family History History reviewed. No pertinent family history.  Social History Social History   Tobacco Use  . Smoking status: Never Smoker  . Smokeless tobacco: Never Used  Substance Use Topics  . Alcohol use: Yes    Comment: other wweekly  . Drug use: Yes    Types: Marijuana   Allergies Patient has no known allergies.  Review of Systems Review of Systems  Constitutional: Negative for fever.  Gastrointestinal: Positive for abdominal pain. Negative for diarrhea, hematochezia, melena, nausea and vomiting.  Genitourinary: Negative for dysuria.   All other systems are reviewed and are negative for acute change except  as noted in the HPI  Physical Exam Vital Signs  I have reviewed the triage vital signs BP 116/84   Pulse 65   Temp 98.2 F (36.8 C)   Resp 20   Ht 5\' 5"  (1.651 m)   Wt 63.5 kg (140 lb)   SpO2 99%   BMI 23.30 kg/m   Physical Exam  Constitutional: He is oriented to person, place, and time. He appears well-developed and well-nourished. No distress.  HENT:  Head: Normocephalic and atraumatic.  Right Ear: External ear normal.  Left Ear: External ear normal.  Nose: Nose normal.  Mouth/Throat: Mucous membranes are normal. No trismus in the jaw.  Eyes: Conjunctivae and EOM are normal. No scleral icterus.  Neck: Normal range of motion and phonation normal.  Cardiovascular: Normal rate and regular rhythm.  Pulmonary/Chest: Effort normal. No stridor. No respiratory distress.  Abdominal: He exhibits no distension. There is no tenderness. There is no rigidity, no rebound and no guarding. No hernia. Hernia confirmed negative in the right inguinal area and confirmed negative in  the left inguinal area.  Genitourinary: Penis normal. Right testis shows swelling and tenderness. Cremasteric reflex is not absent on the right side. Left testis shows no swelling and no tenderness. Cremasteric reflex is not absent on the left side. Circumcised. No penile erythema. No discharge found.  Musculoskeletal: Normal range of motion. He exhibits no edema.  Neurological: He is alert and oriented to person, place, and time.  Skin: He is not diaphoretic.  Psychiatric: He has a normal mood and affect. His behavior is normal.  Vitals reviewed.   ED Results and Treatments Labs (all labs ordered are listed, but only abnormal results are displayed) Labs Reviewed  COMPREHENSIVE METABOLIC PANEL - Abnormal; Notable for the following components:      Result Value   Glucose, Bld 110 (*)    ALT 8 (*)    All other components within normal limits  URINALYSIS, ROUTINE W REFLEX MICROSCOPIC - Abnormal; Notable for the following components:   APPearance TURBID (*)    Leukocytes, UA MODERATE (*)    Bacteria, UA FEW (*)    Squamous Epithelial / LPF 0-5 (*)    All other components within normal limits  LIPASE, BLOOD  CBC  GC/CHLAMYDIA PROBE AMP ([place]) NOT AT [facility]                                                                                                                         EKG  EKG Interpretation  Date/Time:    Ventricular Rate:    PR Interval:    QRS Duration:   QT Interval:    QTC Calculation:   R Axis:     Text Interpretation:        Radiology [place] Scrotum W/doppler  Result Date: 02/21/2018 CLINICAL DATA:  Onset of right testicular pain at 5 a.m. last night. EXAM: SCROTAL ULTRASOUND DOPPLER ULTRASOUND OF THE TESTICLES TECHNIQUE: Complete ultrasound examination of the testicles, epididymis,  and other scrotal structures was performed. Color and spectral Doppler ultrasound were also utilized to evaluate blood flow to the testicles. COMPARISON:  None. FINDINGS: Right  testicle Measurements: 4.5 x 2.0 x 3.1 cm. No focal lesion. There may be slightly increased signal on Doppler imaging in the right testicle relative to the left. Left testicle Measurements: 4.2 x 2.2 x 3.3 cm. No mass or microlithiasis visualized. Right epididymis: Appears swollen. Signal on Doppler imaging in the right epididymis is increased relative to the left. Left epididymis:  Normal in size and appearance. Hydrocele:  None visualized. Varicocele:  None visualized. Pulsed Doppler interrogation of both testes demonstrates normal low resistance arterial and venous waveforms bilaterally. IMPRESSION: Findings compatible with epididymitis on the right. There may be mildly increased Doppler signal in the right testicle relative to the left suggestive of orchitis. The examination is otherwise normal. Electronically Signed   By: Drusilla Kanner M.D.   On: 02/21/2018 10:31   Pertinent labs & imaging results that were available during my care of the patient were reviewed by me and considered in my medical decision making (see chart for details).  Medications Ordered in ED Medications  cefTRIAXone (ROCEPHIN) injection 250 mg (has no administration in time range)  doxycycline (VIBRA-TABS) tablet 100 mg (has no administration in time range)                                                                                                                                    Procedures Procedures  (including critical care time)  Medical Decision Making / ED Course I have reviewed the nursing notes for this encounter and the patient's prior records (if available in EHR or on provided paperwork).    Exam not suspicious for intra-abdominal inflammatory/infectious process.  More suspicious for epididymitis.  Low suspicion for torsion but will obtain ultrasound to rule out.  Ultrasound did reveal evidence of epididymitis without evidence of torsion.  Labs grossly reassuring without leukocytosis.  No electrolyte  derangements or renal insufficiency.  UA with moderate leuks and elevated white blood cell count but not suspicious for urinary tract infection.  No trichomonas seen.  GC/Chlamydia was sent.  Patient will be treated empirically with IM Rocephin and doxycycline.  Additional symptomatic management for epididymitis was discussed.   The patient is safe for discharge with strict return precautions.  Final Clinical Impression(s) / ED Diagnoses Final diagnoses:  Epididymitis   Disposition: Discharge  Condition: Good  I have discussed the results, Dx and Tx plan with the patient who expressed understanding and agree(s) with the plan. Discharge instructions discussed at great length. The patient was given strict return precautions who verbalized understanding of the instructions. No further questions at time of discharge.    ED Discharge Orders        Ordered    doxycycline (VIBRAMYCIN) 100 MG capsule  2 times daily     02/21/18 1107  Follow Up: Department, [facility] [address] [place] [address] [phone]         This chart was dictated using voice recognition software.  Despite best efforts to proofread,  errors can occur which can change the documentation meaning.   Nira Conn, MD 02/21/18 1108

## 2018-02-22 LAB — GC/CHLAMYDIA PROBE AMP (~~LOC~~) NOT AT ARMC
CHLAMYDIA, DNA PROBE: NEGATIVE
Neisseria Gonorrhea: POSITIVE — AB

## 2018-11-30 ENCOUNTER — Emergency Department (HOSPITAL_COMMUNITY)
Admission: EM | Admit: 2018-11-30 | Discharge: 2018-11-30 | Disposition: A | Discharge status: Home / Self Care | Payer: Self-pay | Attending: Emergency Medicine | Admitting: Emergency Medicine

## 2018-11-30 ENCOUNTER — Other Ambulatory Visit: Payer: Self-pay

## 2018-11-30 DIAGNOSIS — K0889 Other specified disorders of teeth and supporting structures: Secondary | ICD-10-CM | POA: Insufficient documentation

## 2018-11-30 DIAGNOSIS — F1290 Cannabis use, unspecified, uncomplicated: Secondary | ICD-10-CM | POA: Insufficient documentation

## 2018-11-30 MED ORDER — NAPROXEN 500 MG PO TABS: 500.0000 mg | ORAL_TABLET | Freq: Two times a day (BID) | ORAL | 0 refills | Status: DC

## 2018-11-30 MED ORDER — OXYCODONE-ACETAMINOPHEN 5-325 MG PO TABS
1.0000 | ORAL_TABLET | Freq: Once | ORAL | Status: AC
  Administered 2018-11-30: 1 via ORAL
  Filled 2018-11-30: qty 1

## 2018-11-30 MED ORDER — AMOXICILLIN-POT CLAVULANATE 875-125 MG PO TABS: 1.0000 | ORAL_TABLET | Freq: Two times a day (BID) | ORAL | 0 refills | Status: AC

## 2018-11-30 NOTE — Discharge Instructions (Signed)
Return to ED for worsening symptoms, facial swelling, trouble breathing, trouble swallowing, fever or neck pain.

## 2018-11-30 NOTE — ED Triage Notes (Signed)
Pt here with c/o teeth pain to the right side of his mouth for 2 days

## 2018-11-30 NOTE — ED Provider Notes (Signed)
[facility] EMERGENCY DEPARTMENT Provider Note   CSN: [id number] Arrival date & time: 11/30/18  1105     History   Chief Complaint No chief complaint on file.   HPI Victor Holmes is a 28 y.o. male who presents to ED for 2-day history of right-sided dental pain.  Describes the pain as sharp, radiates to the right side of his face.  He is unsure exactly which tooth is causing him pain.  He took 1 dose of Tylenol prior to arrival with no improvement in his symptoms.  He last saw dentist several years ago and does not have a dentist to follow-up with.  He denies any trouble breathing, trouble swallowing, trismus, drooling, fever, facial swelling, drainage.  HPI  Past Medical History:  Diagnosis Date  . No pertinent past medical history     There are no active problems to display for this patient.   Past Surgical History:  Procedure Laterality Date  . NASAL CONCHA BULLOSA RESECTION    . ORIF FINGER FRACTURE  04/26/2012   Procedure: OPEN REDUCTION INTERNAL FIXATION (ORIF) METACARPAL (FINGER) FRACTURE;  Surgeon: Sharma Covert, MD;  Location: [facility] OR;  Service: Orthopedics;  Laterality: Right;        Home Medications    Prior to Admission medications   Medication Sig Start Date End Date Taking? Authorizing Provider  amoxicillin-clavulanate (AUGMENTIN) 875-125 MG tablet Take 1 tablet by mouth every 12 (twelve) hours. 11/30/18   Rosa Wyly, PA-C  naproxen (NAPROSYN) 500 MG tablet Take 1 tablet (500 mg total) by mouth 2 (two) times daily. 11/30/18   Breyton Vanscyoc, PA-C  traMADol (ULTRAM) 50 MG tablet Take 1 tablet (50 mg total) by mouth every 6 (six) hours as needed. Patient not taking: Reported on 02/21/2018 10/01/14   Eber Hong, MD    Family History No family history on file.  Social History Social History   Tobacco Use  . Smoking status: Never Smoker  . Smokeless tobacco: Never Used  Substance Use Topics  . Alcohol use: Yes    Comment: other  wweekly  . Drug use: Yes    Types: Marijuana     Allergies   Patient has no known allergies.   Review of Systems Review of Systems  Constitutional: Negative for chills and fever.  HENT: Positive for dental problem. Negative for rhinorrhea, sinus pressure and trouble swallowing.   Respiratory: Negative for shortness of breath.   Musculoskeletal: Negative for neck pain.     Physical Exam Updated Vital Signs BP 135/82   Pulse 63   Temp 97.7 F (36.5 C) (Oral)   Resp 18   SpO2 100%   Physical Exam Vitals signs and nursing note reviewed.  Constitutional:      General: He is not in acute distress.    Appearance: He is well-developed. He is not diaphoretic.  HENT:     Head: Normocephalic and atraumatic.     Mouth/Throat:     Dentition: Normal dentition. No dental tenderness, dental abscesses or gum lesions.     Comments: Right lower wisdom tooth emerging.  No specific area of tenderness.  No gross dental abscess or site of drainage. No facial, neck or cheek swelling noted. No pooling of secretions or trismus.  Normal voice noted with no difficulty swallowing or breathing.  No submandibular erythema, edema or crepitus noted. Eyes:     General: No scleral icterus.    Conjunctiva/sclera: Conjunctivae normal.  Neck:     Musculoskeletal: Normal  range of motion.  Cardiovascular:     Rate and Rhythm: Normal rate and regular rhythm.     Heart sounds: Normal heart sounds.  Pulmonary:     Effort: Pulmonary effort is normal. No respiratory distress.     Breath sounds: Normal breath sounds.  Skin:    Findings: No rash.  Neurological:     Mental Status: He is alert.      ED Treatments / Results  Labs (all labs ordered are listed, but only abnormal results are displayed) Labs Reviewed - No data to display  EKG None  Radiology No results found.  Procedures Procedures (including critical care time)  Medications Ordered in ED Medications  oxyCODONE-acetaminophen  (PERCOCET/ROXICET) 5-325 MG per tablet 1 tablet (has no administration in time range)     Initial Impression / Assessment and Plan / ED Course  I have reviewed the triage vital signs and the nursing notes.  Pertinent labs & imaging results that were available during my care of the patient were reviewed by me and considered in my medical decision making (see chart for details).     Patient with dentalgia. On exam, there is no evidence of a drainable abscess. No trismus, glossal elevation, unilateral tonsillar swelling. No evidence of retropharyngeal or peritonsillar abscess or Ludwig angina.   Symptoms could be due to right lower wisdom tooth emerging.  Nevertheless, there could be concern for infection as he notes he has a history of dental caries and has not been evaluated by dentist in several years.  Will treat with  Augmentin and naproxen. Pt instructed to follow-up with dentist as soon as possible. Resource guide provided with AVS.  Patient is hemodynamically stable, in NAD, and able to ambulate in the ED. Evaluation does not show pathology that would require ongoing emergent intervention or inpatient treatment. I explained the diagnosis to the patient. Pain has been managed and has no complaints prior to discharge. Patient is comfortable with above plan and is stable for discharge at this time. All questions were answered prior to disposition. Strict return precautions for returning to the ED were discussed. Encouraged follow up with PCP.    Portions of this note were generated with Scientist, clinical (histocompatibility and immunogenetics). Dictation errors may occur despite best attempts at proofreading.   Final Clinical Impressions(s) / ED Diagnoses   Final diagnoses:  Pain, dental    ED Discharge Orders         Ordered    amoxicillin-clavulanate (AUGMENTIN) 875-125 MG tablet  Every 12 hours     11/30/18 1346    naproxen (NAPROSYN) 500 MG tablet  2 times daily     11/30/18 1346           Dietrich Pates,  PA-C 11/30/18 1346    Doug Sou, MD 11/30/18 1812

## 2018-11-30 NOTE — ED Notes (Signed)
Patient verbalizes understanding of discharge instructions. Opportunity for questioning and answers were provided. Armband removed by staff, pt discharged from ED.  

## 2019-08-31 ENCOUNTER — Other Ambulatory Visit: Payer: Self-pay

## 2019-08-31 DIAGNOSIS — Z20822 Contact with and (suspected) exposure to covid-19: Secondary | ICD-10-CM

## 2019-09-01 LAB — NOVEL CORONAVIRUS, NAA: SARS-CoV-2, NAA: NOT DETECTED

## 2021-06-18 ENCOUNTER — Other Ambulatory Visit: Payer: Self-pay

## 2021-06-18 ENCOUNTER — Ambulatory Visit (HOSPITAL_COMMUNITY)
Admission: EM | Admit: 2021-06-18 | Discharge: 2021-06-18 | Disposition: A | Discharge status: Home / Self Care | Payer: 59 | Attending: Family Medicine | Admitting: Family Medicine

## 2021-06-18 ENCOUNTER — Encounter (HOSPITAL_COMMUNITY): Payer: Self-pay

## 2021-06-18 DIAGNOSIS — S8012XA Contusion of left lower leg, initial encounter: Secondary | ICD-10-CM | POA: Diagnosis not present

## 2021-06-18 MED ORDER — NAPROXEN 500 MG PO TABS: 500.0000 mg | ORAL_TABLET | Freq: Two times a day (BID) | ORAL | 0 refills | Status: AC | PRN

## 2021-06-18 NOTE — ED Provider Notes (Signed)
[facility]    CSN: [id number] Arrival date & time: 06/18/21  1005      History   Chief Complaint Chief Complaint  Patient presents with   Leg Injury    HPI Victor Holmes is a 30 y.o. male.   Patient presenting today with pain and swelling in the left anterior lower leg following a fall on the steps 3 days ago.  He states he slipped on wooden steps when it was raining, fell onto his left buttock but that his leg was kind of behind him when he fell and hit this area additionally.  Some bruising and swelling to the area that he has been icing off and on but otherwise not trying anything over-the-counter for symptoms.  Denies leg weakness, numbness, tingling, joint pain or swelling, range of motion issues, pain in the buttock.  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   Past Medical History:  Diagnosis Date   No pertinent past medical history     There are no problems to display for this patient.   Past Surgical History:  Procedure Laterality Date   NASAL CONCHA BULLOSA RESECTION     ORIF FINGER FRACTURE  04/26/2012   Procedure: OPEN REDUCTION INTERNAL FIXATION (ORIF) METACARPAL (FINGER) FRACTURE;  Surgeon: Sharma Covert, MD;  Location: [facility] OR;  Service: Orthopedics;  Laterality: Right;       Home Medications    Prior to Admission medications   Medication Sig Start Date End Date Taking? Authorizing Provider  amoxicillin-clavulanate (AUGMENTIN) 875-125 MG tablet Take 1 tablet by mouth every 12 (twelve) hours. 11/30/18   Khatri, Hina, PA-C  naproxen (NAPROSYN) 500 MG tablet Take 1 tablet (500 mg total) by mouth 2 (two) times daily as needed. 06/18/21   Particia Nearing, PA-C  traMADol (ULTRAM) 50 MG tablet Take 1 tablet (50 mg total) by mouth every 6 (six) hours as needed. Patient not taking: Reported on 02/21/2018 10/01/14   Eber Hong, MD    Family History History reviewed. No pertinent family  history.  Social History Social History   Tobacco Use   Smoking status: Never   Smokeless tobacco: Never  Vaping Use   Vaping Use: Never used  Substance Use Topics   Alcohol use: Yes    Comment: other wweekly   Drug use: Yes    Types: Marijuana     Allergies   Patient has no known allergies.   Review of Systems Review of Systems Per HPI  Physical Exam Triage Vital Signs ED Triage Vitals  Enc Vitals Group     BP 06/18/21 1127 106/69     Pulse Rate 06/18/21 1127 66     Resp 06/18/21 1127 18     Temp 06/18/21 1127 98.4 F (36.9 C)     Temp Source 06/18/21 1127 Oral     SpO2 06/18/21 1127 100 %     Weight --      Height --      Head Circumference --      Peak Flow --      Pain Score 06/18/21 1126 8     Pain Loc --      Pain Edu? --      Excl. in GC? --    No data found.  Updated Vital Signs BP 106/69 (BP Location: Right Arm)   Pulse 66   Temp 98.4 F (36.9 C) (Oral)   Resp 18  SpO2 100%   Visual Acuity Right Eye Distance:   Left Eye Distance:   Bilateral Distance:    Right Eye Near:   Left Eye Near:    Bilateral Near:     Physical Exam Vitals and nursing note reviewed.  Constitutional:      Appearance: Normal appearance.  HENT:     Head: Atraumatic.  Eyes:     Extraocular Movements: Extraocular movements intact.     Conjunctiva/sclera: Conjunctivae normal.  Cardiovascular:     Rate and Rhythm: Normal rate and regular rhythm.  Pulmonary:     Effort: Pulmonary effort is normal.     Breath sounds: Normal breath sounds.  Musculoskeletal:        General: Swelling, tenderness and signs of injury present. No deformity. Normal range of motion.     Cervical back: Normal range of motion and neck supple.     Comments: Good range of motion in all joints of the left lower extremity, no joint swelling.  Tender to palpation left lateral anterior lower leg.  Normal gait.  Skin:    General: Skin is warm and dry.     Findings: Bruising present. No  erythema.     Comments: Small contusion present to left lateral anterior lower leg, tender to palpation  Neurological:     General: No focal deficit present.     Mental Status: He is oriented to person, place, and time.     Motor: No weakness.     Gait: Gait normal.     Comments: Left lower extremity neurovascularly intact  Psychiatric:        Mood and Affect: Mood normal.        Thought Content: Thought content normal.        Judgment: Judgment normal.     UC Treatments / Results  Labs (all labs ordered are listed, but only abnormal results are displayed) Labs Reviewed - No data to display  EKG   Radiology No results found.  Procedures Procedures (including critical care time)  Medications Ordered in UC Medications - No data to display  Initial Impression / Assessment and Plan / UC Course  I have reviewed the triage vital signs and the nursing notes.  Pertinent labs & imaging results that were available during my care of the patient were reviewed by me and considered in my medical decision making (see chart for details).     Contusion from fall, no evidence of bony injury so will defer imaging with shared decision making.  RICE protocol reviewed, naproxen as needed for pain and inflammation, work note given for rest.  Final Clinical Impressions(s) / UC Diagnoses   Final diagnoses:  Contusion of left lower extremity, initial encounter   Discharge Instructions   None    ED Prescriptions     Medication Sig Dispense Auth. Provider   naproxen (NAPROSYN) 500 MG tablet Take 1 tablet (500 mg total) by mouth 2 (two) times daily as needed. 15 tablet Particia Nearing, [place]      PDMP not reviewed this encounter.   Particia Nearing, [place] 06/18/21 1219

## 2021-06-18 NOTE — ED Triage Notes (Signed)
Pt in with c/o left leg injury that occurred a few days ago after falling down some steps  Pt states he noticed swelling yesterday  Pt has not taken any medication for sx

## 2021-10-18 ENCOUNTER — Encounter (HOSPITAL_COMMUNITY): Payer: Self-pay | Admitting: Emergency Medicine

## 2021-10-18 ENCOUNTER — Other Ambulatory Visit: Payer: Self-pay

## 2021-10-18 ENCOUNTER — Emergency Department (HOSPITAL_COMMUNITY)
Admission: EM | Admit: 2021-10-18 | Discharge: 2021-10-18 | Disposition: A | Discharge status: Home / Self Care | Payer: 59 | Attending: Emergency Medicine | Admitting: Emergency Medicine

## 2021-10-18 ENCOUNTER — Emergency Department (HOSPITAL_COMMUNITY): Payer: 59

## 2021-10-18 DIAGNOSIS — L03116 Cellulitis of left lower limb: Secondary | ICD-10-CM

## 2021-10-18 DIAGNOSIS — B353 Tinea pedis: Secondary | ICD-10-CM | POA: Insufficient documentation

## 2021-10-18 MED ORDER — TRAMADOL HCL 50 MG PO TABS: 50.0000 mg | ORAL_TABLET | Freq: Four times a day (QID) | ORAL | 0 refills | Status: AC | PRN

## 2021-10-18 MED ORDER — DOXYCYCLINE HYCLATE 100 MG PO CAPS: 100.0000 mg | ORAL_CAPSULE | Freq: Two times a day (BID) | ORAL | 0 refills | Status: AC

## 2021-10-18 NOTE — ED Triage Notes (Signed)
Patient complains of left foot pain that started a few weeks ago, states he does not recall any specific injury. Patient here for evaluation as pain has not resolved. Patient alert, oriented, ambulatory, and in no apparent distress at this time.

## 2021-10-18 NOTE — ED Provider Notes (Signed)
Emergency Medicine Provider Triage Evaluation Note  Victor Holmes , a 30 y.o. male  was evaluated in triage.  Pt complains of left foot pain.  Started 2 weeks ago, worse when he walks, put on shoes and socks.  Unable to identify an inciting source of the pain.  Denies any trauma to the foot.  No history of gout or diabetes.  Pain is primarily to the top of his foot along the pinky side  Review of Systems  Positive: Foot pain Negative:   Physical Exam  BP 111/81 (BP Location: Right Arm)    Pulse 71    Temp 98.1 F (36.7 C) (Oral)    Resp 14    SpO2 99%  Gen:   Awake, no distress   Resp:  Normal effort  MSK:   Patient tolerates passive range of motion to the ankle without any tenderness.  No tenderness with palpation of the calcaneus.  Tenderness over the dorsal aspect of the foot, primarily along the little finger side.  No focal tenderness Other:  DP and PT 2+, cap refill less than 2  Medical Decision Making  Medically screening exam initiated at 2:38 PM.  Appropriate orders placed.  Victor Holmes was informed that the remainder of the evaluation will be completed by another provider, this initial triage assessment does not replace that evaluation, and the importance of remaining in the ED until their evaluation is complete.  xray   Victor Arista, PA-C 10/18/21 1441    Rozelle Logan, DO 10/24/21 1147

## 2021-10-18 NOTE — ED Notes (Signed)
Patient alerts and oriented x4, ambulatory with steady gait at discharge. Patient taken all his belongings with him and denies missing anything upon discharge from the ED.

## 2021-10-18 NOTE — ED Provider Notes (Signed)
[facility] EMERGENCY DEPARTMENT Provider Note   CSN: [id number] Arrival date & time: 10/18/21  1418     History Chief Complaint  Patient presents with   Foot Pain    Victor Holmes is a 30 y.o. male with no significant past medical history presents emergency department chief complaint of left foot pain.  Patient states that he thinks he may have stubbed his toe a couple weeks ago but does not really remember injuring it.  2 days ago he began having pain, redness and swelling on the dorsum of his left foot.  It is worse when he touches it, applies pressure or walks.  Better with rest.  He denies fevers or chills.  He is never had anything like this before.   Foot Pain      Past Medical History:  Diagnosis Date   No pertinent past medical history     There are no problems to display for this patient.   Past Surgical History:  Procedure Laterality Date   NASAL CONCHA BULLOSA RESECTION     ORIF FINGER FRACTURE  04/26/2012   Procedure: OPEN REDUCTION INTERNAL FIXATION (ORIF) METACARPAL (FINGER) FRACTURE;  Surgeon: Sharma Covert, MD;  Location: [facility] OR;  Service: Orthopedics;  Laterality: Right;       No family history on file.  Social History   Tobacco Use   Smoking status: Never   Smokeless tobacco: Never  Vaping Use   Vaping Use: Never used  Substance Use Topics   Alcohol use: Yes    Comment: other wweekly   Drug use: Yes    Types: Marijuana    Home Medications Prior to Admission medications   Medication Sig Start Date End Date Taking? Authorizing Provider  amoxicillin-clavulanate (AUGMENTIN) 875-125 MG tablet Take 1 tablet by mouth every 12 (twelve) hours. 11/30/18   Khatri, Hina, PA-C  naproxen (NAPROSYN) 500 MG tablet Take 1 tablet (500 mg total) by mouth 2 (two) times daily as needed. 06/18/21   Particia Nearing, PA-C  traMADol (ULTRAM) 50 MG tablet Take 1 tablet (50 mg total) by mouth every 6 (six) hours as needed. Patient not  taking: Reported on 02/21/2018 10/01/14   Eber Hong, MD    Allergies    Patient has no known allergies.  Review of Systems   Review of Systems Ten systems reviewed and are negative for acute change, except as noted in the HPI.   Physical Exam Updated Vital Signs BP 111/81 (BP Location: Right Arm)    Pulse 71    Temp 98.1 F (36.7 C) (Oral)    Resp 14    SpO2 99%   Physical Exam Vitals and nursing note reviewed.  Constitutional:      General: He is not in acute distress.    Appearance: He is well-developed. He is not diaphoretic.  HENT:     Head: Normocephalic and atraumatic.  Eyes:     General: No scleral icterus.    Conjunctiva/sclera: Conjunctivae normal.  Cardiovascular:     Rate and Rhythm: Normal rate and regular rhythm.     Heart sounds: Normal heart sounds.  Pulmonary:     Effort: Pulmonary effort is normal. No respiratory distress.     Breath sounds: Normal breath sounds.  Abdominal:     Palpations: Abdomen is soft.     Tenderness: There is no abdominal tenderness.  Musculoskeletal:     Cervical back: Normal range of motion and neck supple.     Comments:  Left foot evaluation reveals erythema over the dorsum of the left foot localized around toes 2 through 5.  He has obvious tinea between the toes and on the back of the foot.  There is some mild lymphangitis up to the ankle without further spread.  Skin:    General: Skin is warm and dry.  Neurological:     Mental Status: He is alert.  Psychiatric:        Behavior: Behavior normal.    ED Results / Procedures / Treatments   Labs (all labs ordered are listed, but only abnormal results are displayed) Labs Reviewed - No data to display  EKG None  Radiology No results found.  Procedures Procedures   Medications Ordered in ED Medications - No data to display  ED Course  I have reviewed the triage vital signs and the nursing notes.  Pertinent labs & imaging results that were available during my care  of the patient were reviewed by me and considered in my medical decision making (see chart for details).    MDM Rules/Calculators/A&P                         Patient here with foo I reviewed images of a left foot x-ray done at triage which showed no acute abnormalities. Patient appears to have tinea pedis which is likely the underlying impetus for cellulitis of the foot.  Patient will be treated with oral antibiotics.  I have asked him to also treat with over-the-counter athlete's foot cream.  Discussed outpatient follow-up and return precautions.  PDMP reviewed.  Discharged with Doxy and tramadol.  Patient appears otherwise appropriate for discharge at this time Final Clinical Impression(s) / ED Diagnoses Final diagnoses:  None    Rx / DC Orders ED Discharge Orders     None        Arthor Captain, PA-C 10/18/21 1555    Horton, Mayer Masker, MD 10/21/21 0236

## 2021-10-18 NOTE — Discharge Instructions (Signed)

## 2023-02-24 IMAGING — DX DG FOOT COMPLETE 3+V*L*
3 series · 3 of 3 positions shown · non-contrast
Comparison: None.

CLINICAL DATA: Lateral LEFT foot pain for 1 week, no history of
injury by report.

EXAM:
LEFT FOOT - COMPLETE 3+ VIEW

[foot ap]
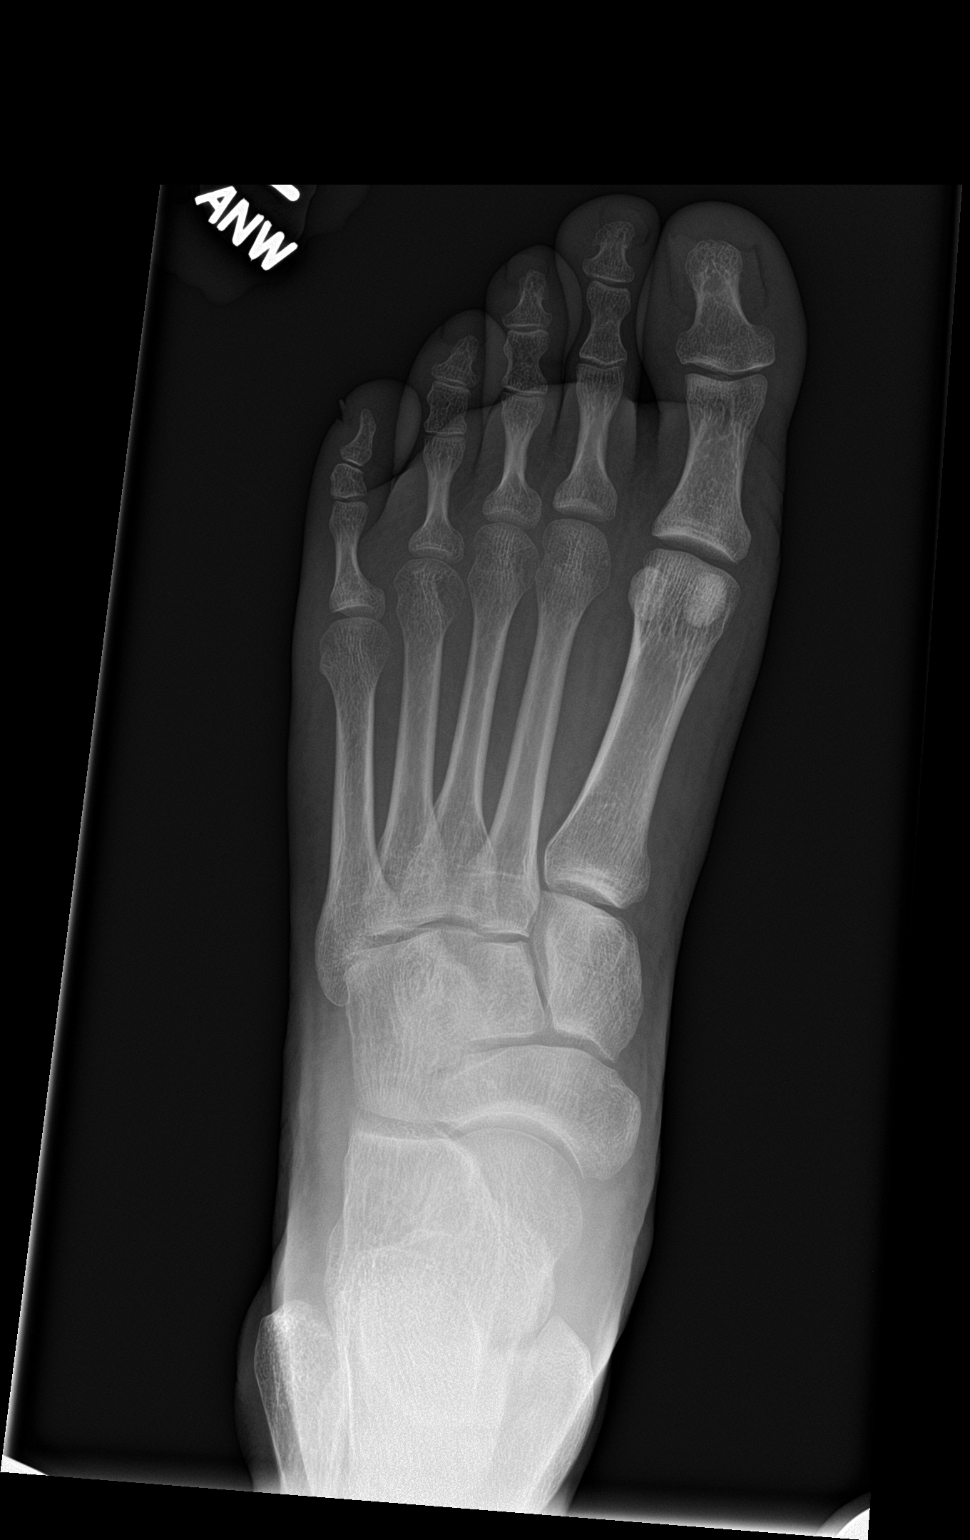

[foot obl]
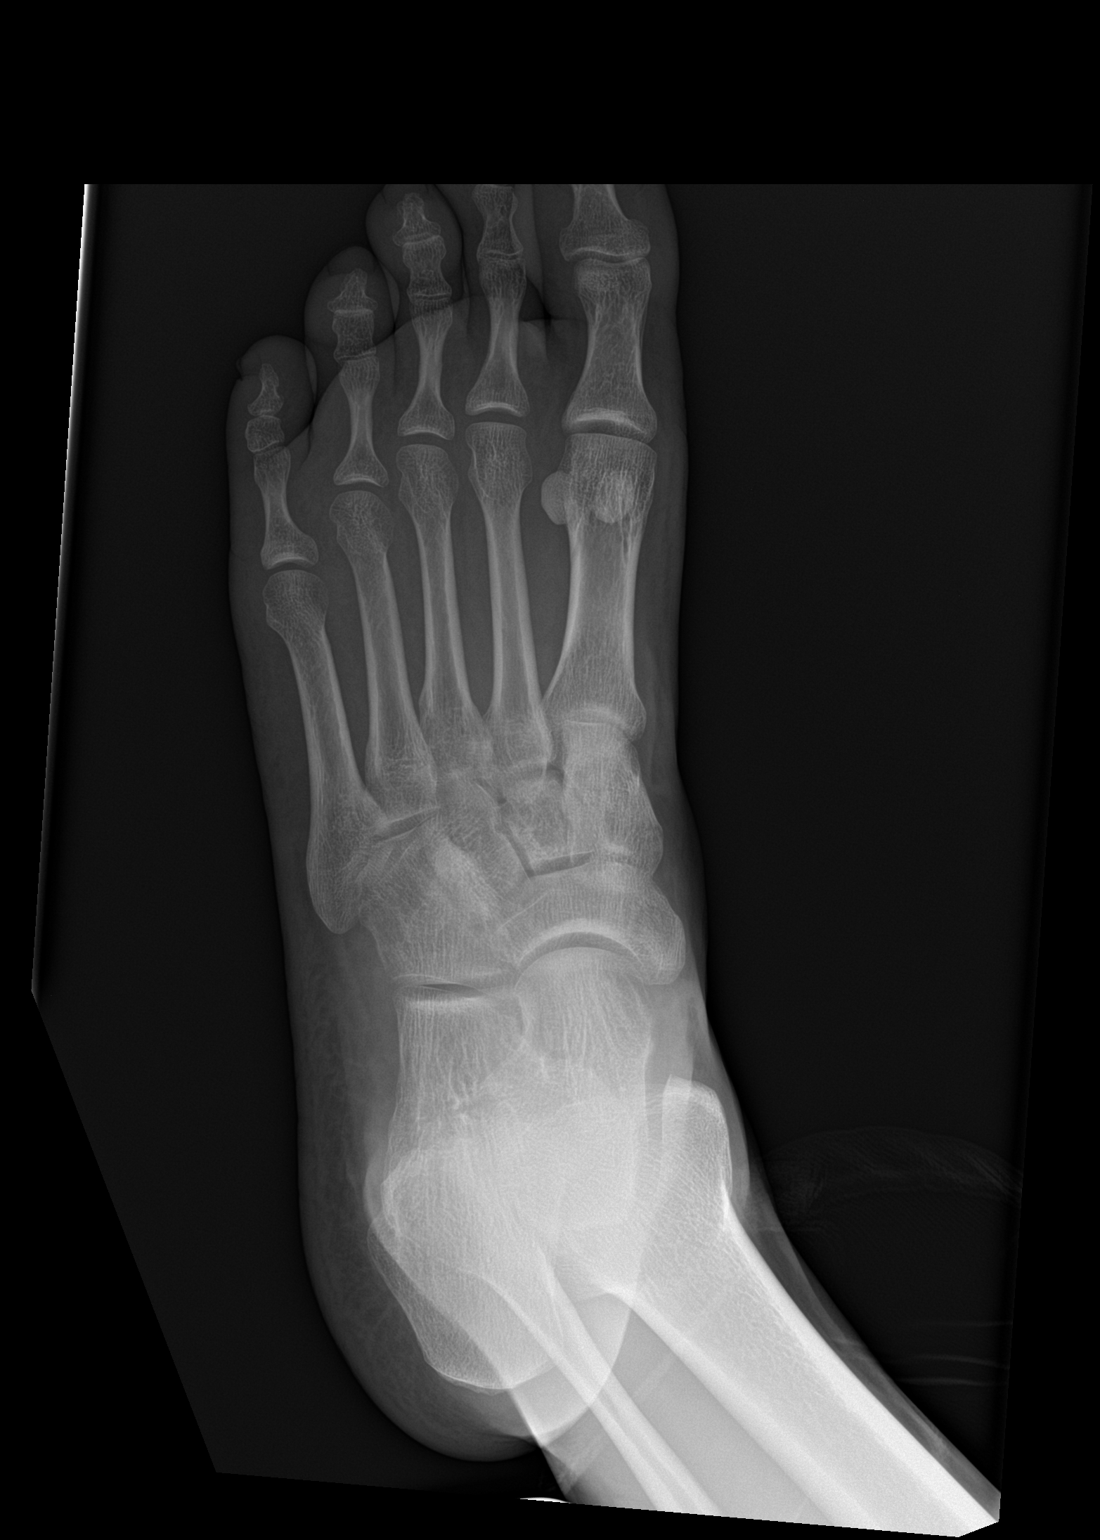

[foot lat]
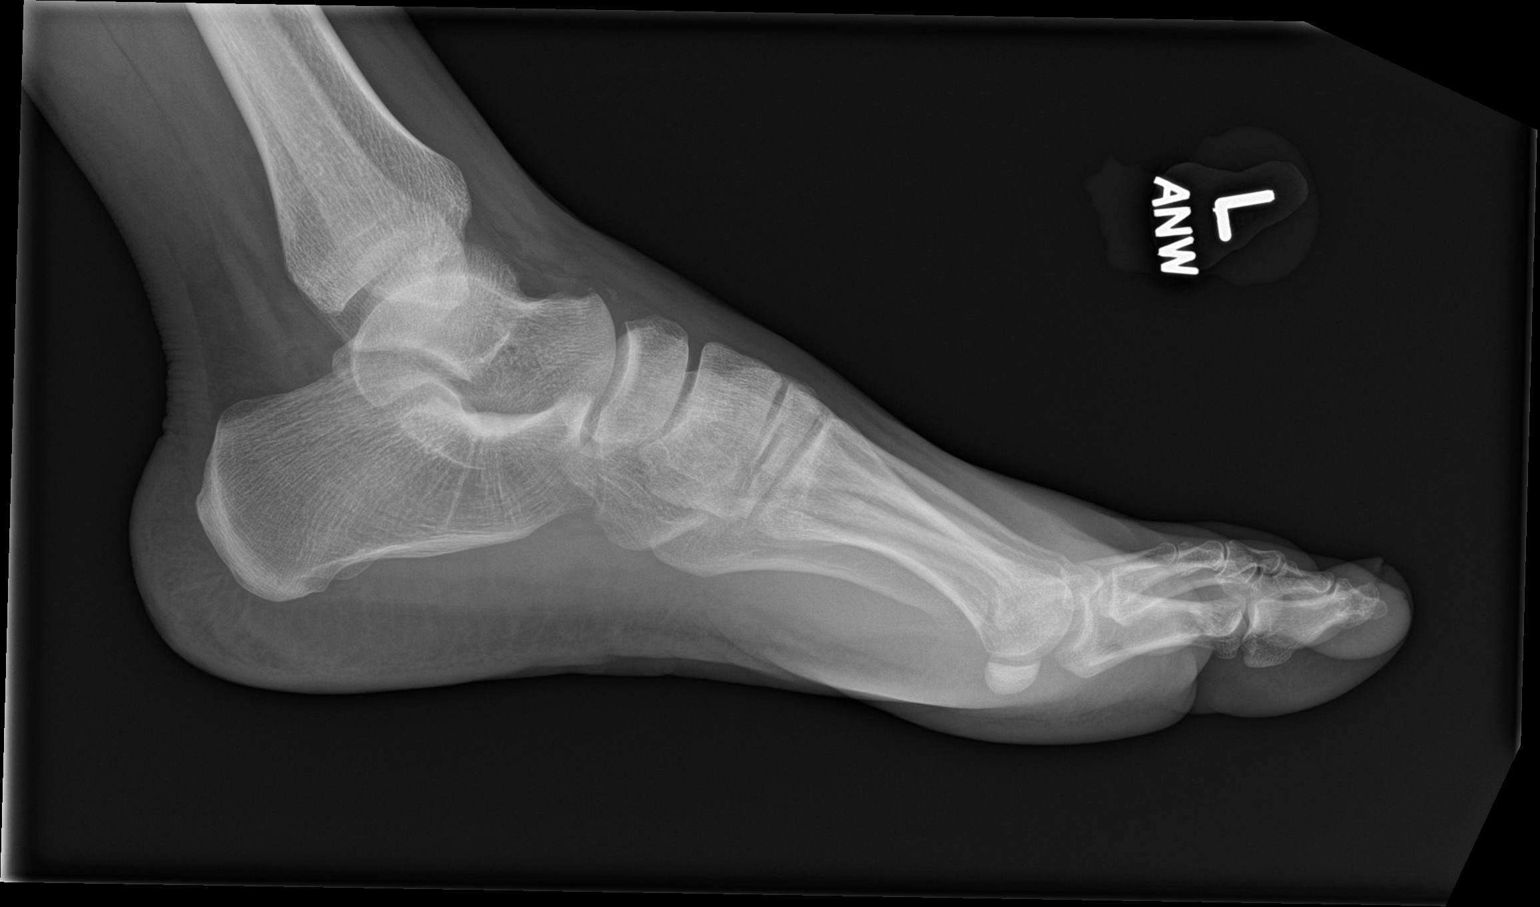

[3 of 3 positions shown; findings below may reference images not displayed]

FINDINGS: There is no evidence of fracture or dislocation. There is no
evidence of arthropathy or other focal bone abnormality. Soft
tissues are unremarkable.
IMPRESSION: Negative.

## 2023-03-19 ENCOUNTER — Ambulatory Visit (HOSPITAL_COMMUNITY)
Admission: EM | Admit: 2023-03-19 | Discharge: 2023-03-19 | Disposition: A | Discharge status: Home / Self Care | Payer: Self-pay | Attending: Sports Medicine | Admitting: Sports Medicine

## 2023-03-19 ENCOUNTER — Encounter (HOSPITAL_COMMUNITY): Payer: Self-pay | Admitting: Emergency Medicine

## 2023-03-19 ENCOUNTER — Ambulatory Visit (INDEPENDENT_AMBULATORY_CARE_PROVIDER_SITE_OTHER): Payer: Self-pay

## 2023-03-19 DIAGNOSIS — S60221A Contusion of right hand, initial encounter: Secondary | ICD-10-CM

## 2023-03-19 DIAGNOSIS — Z7251 High risk heterosexual behavior: Secondary | ICD-10-CM | POA: Insufficient documentation

## 2023-03-19 LAB — HIV ANTIBODY (ROUTINE TESTING W REFLEX): HIV Screen 4th Generation wRfx: NONREACTIVE

## 2023-03-19 LAB — HEPATITIS C ANTIBODY: HCV Ab: NONREACTIVE

## 2023-03-19 LAB — RPR: RPR Ser Ql: NONREACTIVE

## 2023-03-19 NOTE — ED Provider Notes (Signed)
[facility]    CSN: [id number] Arrival date & time: 03/19/23  0807      History   Chief Complaint Chief Complaint  Patient presents with   Hand Pain   SEXUALLY TRANSMITTED DISEASE    HPI Victor Holmes is a 32 y.o. male.   He is here today with chief complaint of right hand pain after he hit it on the corner of a counter on Sunday.  He reports he has tried a little bit of heat with minimal relief of his pain.  Most of his pain is located on the dorsal surface of his hand and his third and fourth metacarpal.  He is concerned because he has had surgery on that hand before and he has metal plate there.  He feels that he has a little bit of numbness in his fourth and fifth fingers and some weakness with his grip strength. Of note he has also wanting to be screened for STIs.  He reports his ex girlfriend called him a couple days ago and stated that she had a trichomonas infection and that he should be tested.  He reports there is 1 time had unprotected intercourse recently, all the other times he used condoms.  He denies any penile discharge, urinary discomfort or abdominal pain.   Hand Pain    Past Medical History:  Diagnosis Date   No pertinent past medical history     There are no problems to display for this patient.   Past Surgical History:  Procedure Laterality Date   NASAL CONCHA BULLOSA RESECTION     ORIF FINGER FRACTURE  04/26/2012   Procedure: OPEN REDUCTION INTERNAL FIXATION (ORIF) METACARPAL (FINGER) FRACTURE;  Surgeon: Sharma Covert, MD;  Location: [facility] OR;  Service: Orthopedics;  Laterality: Right;       Home Medications    Prior to Admission medications   Medication Sig Start Date End Date Taking? Authorizing Provider  amoxicillin-clavulanate (AUGMENTIN) 875-125 MG tablet Take 1 tablet by mouth every 12 (twelve) hours. 11/30/18   Khatri, Hina, PA-C  doxycycline (VIBRAMYCIN) 100 MG capsule Take 1 capsule (100 mg total) by mouth 2 (two) times  daily. One po bid x 7 days 10/18/21   Arthor Captain, PA-C  naproxen (NAPROSYN) 500 MG tablet Take 1 tablet (500 mg total) by mouth 2 (two) times daily as needed. 06/18/21   Particia Nearing, PA-C  traMADol (ULTRAM) 50 MG tablet Take 1 tablet (50 mg total) by mouth every 6 (six) hours as needed. Patient not taking: Reported on 02/21/2018 10/01/14   Eber Hong, MD  traMADol (ULTRAM) 50 MG tablet Take 1 tablet (50 mg total) by mouth every 6 (six) hours as needed. 10/18/21   Arthor Captain, PA-C    Family History No family history on file.  Social History Social History   Tobacco Use   Smoking status: Never   Smokeless tobacco: Never  Vaping Use   Vaping Use: Never used  Substance Use Topics   Alcohol use: Yes    Comment: other wweekly   Drug use: Yes    Types: Marijuana     Allergies   Patient has no known allergies.   Review of Systems Review of Systems as listed above in HPI   Physical Exam Triage Vital Signs ED Triage Vitals [03/19/23 0845]  Enc Vitals Group     BP 111/76     Pulse Rate 75     Resp 15     Temp 98.3 F (  36.8 C)     Temp src      SpO2 98 %     Weight      Height      Head Circumference      Peak Flow      Pain Score 3     Pain Loc      Pain Edu?      Excl. in GC?    No data found.  Updated Vital Signs BP 111/76 (BP Location: Right Arm)   Pulse 75   Temp 98.3 F (36.8 C)   Resp 15   SpO2 98%   Physical Exam Vitals reviewed.  Constitutional:      General: He is not in acute distress.    Appearance: Normal appearance. He is normal weight. He is not ill-appearing, toxic-appearing or diaphoretic.  HENT:     Head: Normocephalic.  Cardiovascular:     Rate and Rhythm: Normal rate.  Pulmonary:     Effort: Pulmonary effort is normal.  Skin:    General: Skin is warm.     Findings: No bruising or lesion.  Neurological:     Mental Status: He is alert.   Right hand: Fifth digit slight mallet deformity secondary to previous  injury.  Well-healed surgical incision over the fourth metacarpal.  No ecchymosis edema or effusion.  Some tenderness to palpation at the base of the third and fourth metacarpal.  Grip strength slightly decreased when compared to the left secondary to pain.  Sensation intact to light touch.  Radial pulse 2+.   UC Treatments / Results  Labs (all labs ordered are listed, but only abnormal results are displayed) Labs Reviewed  HIV ANTIBODY (ROUTINE TESTING W REFLEX)  RPR  HEPATITIS C ANTIBODY  CYTOLOGY, (ORAL, ANAL, URETHRAL) ANCILLARY ONLY    EKG   Radiology No results found.  Procedures Procedures (including critical care time)  Medications Ordered in UC Medications - No data to display  Initial Impression / Assessment and Plan / UC Course  I have reviewed the triage vital signs and the nursing notes.  Pertinent labs & imaging results that were available during my care of the patient were reviewed by me and considered in my medical decision making (see chart for details).     Right hand contusion X-rays right hand, no obvious fracture, evidence of previous hardware that is well aligned in place, no obvious loosening.  Recommend he continue with ice or heat whichever is more comfortable for him and some over-the-counter ibuprofen or Tylenol as needed.  Work on gentle range of motion exercises.  Follow-up with your primary care provider if symptoms worsen or fail to improve.  STI screening, penile swab collected today along with blood work.  Will call you with abnormal results and treat you appropriately. Final Clinical Impressions(s) / UC Diagnoses   Final diagnoses:  Contusion of right hand, initial encounter  High risk heterosexual behavior   Discharge Instructions   None    ED Prescriptions   None    PDMP not reviewed this encounter.   Claudie Leach, DO 03/19/23 [phone]

## 2023-03-19 NOTE — Discharge Instructions (Signed)
There is no new fracture in your right hand and your hardware looks to be in appropriate position.  I recommend he continue with ice or heat whichever is more comfortable and some over-the-counter ibuprofen for your pain.  Work on gentle range of motion exercises and stretches.  Follow-up with your primary care provider We have collected some blood work and a penile swab for STI screening.  Urgent care staff will call you with abnormal results and appropriate treatment.

## 2023-03-19 NOTE — ED Triage Notes (Signed)
Pt reports that had right hand surgery few years back and re-hit at work and having pain again.   Pt exposure to Trich. And wanting STD testing.

## 2023-03-20 ENCOUNTER — Telehealth (HOSPITAL_COMMUNITY): Payer: Self-pay | Admitting: *Deleted

## 2023-03-20 NOTE — Telephone Encounter (Signed)
Pt requesting lab results; informed results are not complete at this time, and that results can take 2-5 days. Pt verbalized understanding.

## 2023-03-23 ENCOUNTER — Telehealth (HOSPITAL_COMMUNITY): Payer: Self-pay

## 2023-03-23 LAB — CYTOLOGY, (ORAL, ANAL, URETHRAL) ANCILLARY ONLY
Chlamydia: POSITIVE — AB
Comment: NEGATIVE
Comment: NEGATIVE
Comment: NORMAL
Neisseria Gonorrhea: POSITIVE — AB
Trichomonas: NEGATIVE

## 2023-03-23 MED ORDER — DOXYCYCLINE HYCLATE 100 MG PO CAPS: 100.0000 mg | ORAL_CAPSULE | Freq: Two times a day (BID) | ORAL | 0 refills | Status: AC

## 2023-03-23 NOTE — Telephone Encounter (Signed)
Pt called requesting treatment for positive labs. Pt notified to return to clinic for Rocephin injection and doxycycline sent to pharmacy on file.

## 2023-03-24 ENCOUNTER — Ambulatory Visit (HOSPITAL_COMMUNITY)
Admission: EM | Admit: 2023-03-24 | Discharge: 2023-03-24 | Disposition: A | Discharge status: Home / Self Care | Payer: Self-pay | Attending: Internal Medicine | Admitting: Internal Medicine

## 2023-03-24 DIAGNOSIS — A549 Gonococcal infection, unspecified: Secondary | ICD-10-CM

## 2023-03-24 MED ORDER — LIDOCAINE HCL (PF) 1 % IJ SOLN
INTRAMUSCULAR | Status: AC
  Filled 2023-03-24: qty 2

## 2023-03-24 MED ORDER — CEFTRIAXONE SODIUM 500 MG IJ SOLR
INTRAMUSCULAR | Status: AC
  Filled 2023-03-24: qty 500

## 2023-03-24 MED ORDER — CEFTRIAXONE SODIUM 500 MG IJ SOLR
500.0000 mg | INTRAMUSCULAR | Status: DC
  Administered 2023-03-24: 500 mg via INTRAMUSCULAR

## 2023-03-24 NOTE — ED Triage Notes (Signed)
Patient presenting for Gonorrhea treatment with Rocephin 500 mg. No allergy to medication according to the Patient.

## 2023-03-24 NOTE — ED Notes (Signed)
Patient given rocephin injection in the right upper outer quadrant. Patient tolerated well.
